# Patient Record
Sex: Male | Born: 1985 | Race: White | Hispanic: No | Marital: Married | Smoking: Never smoker
Health system: Southern US, Community
[De-identification: ages and names within clinical notes are randomized; demographics above are authoritative.]

## PROBLEM LIST (undated history)

## (undated) DIAGNOSIS — G4700 Insomnia, unspecified: Secondary | ICD-10-CM

## (undated) DIAGNOSIS — D619 Aplastic anemia, unspecified: Secondary | ICD-10-CM

## (undated) DIAGNOSIS — E7800 Pure hypercholesterolemia, unspecified: Secondary | ICD-10-CM

## (undated) HISTORY — PX: BONE MARROW TRANSPLANT: SHX200

---

## 2018-10-02 ENCOUNTER — Ambulatory Visit
Admission: EM | Admit: 2018-10-02 | Discharge: 2018-10-02 | Disposition: A | Discharge status: Home / Self Care | Payer: 59

## 2018-10-02 ENCOUNTER — Other Ambulatory Visit: Payer: Self-pay

## 2018-10-02 DIAGNOSIS — R1011 Right upper quadrant pain: Secondary | ICD-10-CM

## 2018-10-02 HISTORY — DX: Aplastic anemia, unspecified: D61.9

## 2018-10-02 HISTORY — DX: Pure hypercholesterolemia, unspecified: E78.00

## 2018-10-02 LAB — POCT URINALYSIS DIP (MANUAL ENTRY)
Bilirubin, UA: NEGATIVE
Blood, UA: NEGATIVE
Glucose, UA: NEGATIVE mg/dL
Ketones, POC UA: NEGATIVE mg/dL
Leukocytes, UA: NEGATIVE
Nitrite, UA: NEGATIVE
Protein Ur, POC: NEGATIVE mg/dL
Spec Grav, UA: 1.01 (ref 1.010–1.025)
Urobilinogen, UA: 0.2 E.U./dL
pH, UA: 6 (ref 5.0–8.0)

## 2018-10-02 NOTE — ED Provider Notes (Signed)
[facility]    CSN: [id number] Arrival date & time: 10/02/18  1554     History   Chief Complaint Chief Complaint  Patient presents with  . Flank Pain    HPI Bobby Duran is a 33 y.o. male presenting for RUQ pain.  Patient states pain is dull/achy and intermittent w/o known aggravating or alleviating factors.  Patient has h/o aplastic anemia and underwent bone marrow transplant (BMT) 8 years ago.  Patient was immunosuppressed 4 years thereafter; not currently immunosuppressed.  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  Patient lives in [place] and has been in touch with his PCP and BMT team since symptom onset.  Patient states his BMT advised getting CT scan done for further eval in conjunction with LFTs.  Patient states he is trying to avoid ED due to concern for COVID.  Denies recent illness, sick contact, fever, dizziness, fatigue, decreased appetite, nausea/vomiting, easy bruising/bleeding, hematochezia, melena, dysuria, hematuria.  Labs last done by PCP in Sept 2019: ast 21, alt 39, ggt 234, Tbili 0.7, direct bili 0.2, alk phos 45, sCr 0.92, hgb 13.7.    Past Medical History:  Diagnosis Date  . Aplastic anemia ([place])   . High cholesterol     There are no active problems to display for this patient.   Past Surgical History:  Procedure Laterality Date  . BONE MARROW TRANSPLANT         Home Medications    Prior to Admission medications   Medication Sig Start Date End Date Taking? Authorizing Provider  LORazepam (ATIVAN) 1 MG tablet Take 1 mg by mouth at bedtime.   Yes [provider]    Family History No family history on file.  Social History Social History   Tobacco Use  . Smoking status: Never Smoker  . Smokeless tobacco: Never Used  Substance Use Topics  . Alcohol use: Yes  . Drug use: Not Currently     Allergies   Patient has no known allergies.   Review of Systems As per HPI   Physical Exam Triage Vital Signs ED Triage Vitals  Enc Vitals Group     BP 10/02/18 1608 (!) 151/93     Pulse Rate 10/02/18 1608 62     Resp 10/02/18 1608 18     Temp 10/02/18 1615 98.3 F (36.8 C)     Temp Source 10/02/18 1615 Oral     SpO2 10/02/18 1608 95 %     Weight --      Height --      Head Circumference --      Peak Flow --      Pain Score 10/02/18 1608 3     Pain Loc --      Pain Edu? --      Excl. in [place]? --    No data found.  Updated Vital Signs BP (!) 151/93 (BP Location: Left Arm)   Pulse 62   Temp 98.3 F (36.8 C) (Oral)   Resp 18   SpO2 95%   Visual Acuity Right Eye Distance:   Left Eye Distance:   Bilateral Distance:    Right Eye Near:   Left Eye Near:    Bilateral Near:     Physical Exam Constitutional:      General: He is not in acute distress.    Appearance: Normal appearance. He is normal weight. He is not toxic-appearing.  HENT:     Head: Normocephalic and atraumatic.  Mouth/Throat:     Mouth: Mucous membranes are moist.     Pharynx: No oropharyngeal exudate or posterior oropharyngeal erythema.  Eyes:     General: No scleral icterus.    Extraocular Movements: Extraocular movements intact.     Conjunctiva/sclera: Conjunctivae normal.  Neck:     Musculoskeletal: Normal range of motion and neck supple.  Cardiovascular:     Rate and Rhythm: Normal rate and regular rhythm.  Pulmonary:     Effort: Pulmonary effort is normal. No respiratory distress.     Breath sounds: Normal breath sounds. No wheezing or rales.  Abdominal:     General: Abdomen is flat. Bowel sounds are normal. There is no distension.     Palpations: Abdomen is soft. There is no mass.     Tenderness: There is abdominal tenderness. There is no right CVA tenderness, left CVA tenderness, guarding or rebound.     Hernia: No hernia is present.     Comments: Right upper quadrant tenderness, negative murphy's sign.  Able to palpate liver edge with deep inspiration.  Pain does  not vary w/ deep inspiration.  Lymphadenopathy:     Cervical: No cervical adenopathy.  Neurological:     Mental Status: He is alert.      UC Treatments / Results  Labs (all labs ordered are listed, but only abnormal results are displayed) Labs Reviewed  CBC WITH DIFFERENTIAL/PLATELET  COMPREHENSIVE METABOLIC PANEL  POCT URINALYSIS DIP (MANUAL ENTRY)    EKG None  Radiology No results found.  Procedures Procedures (including critical care time)  Medications Ordered in UC Medications - No data to display  Initial Impression / Assessment and Plan / UC Course  I have reviewed the triage vital signs and the nursing notes.  Pertinent labs & imaging results that were available during my care of the patient were reviewed by me and considered in my medical decision making (see chart for details).     Pt w/ h/o aplastic anemia in remission after successful BMT presenting w/ RUQ pain x 1 wk.  Labs last done by PCP in Sept 2019: ast 21, alt 39, ggt 234, Tbili 0.7, direct bili 0.2, alk phos 45, sCr 0.92, hgb 13.7.  Patient is stable in office today.  Will check labs and strict return/ER precautions were discussed.  Case was discussed w/ Cathlean Sauer, PA-C who will contact patient with labs/plan when available. Final Clinical Impressions(s) / UC Diagnoses   Final diagnoses:  Abdominal pain, right upper quadrant     Discharge Instructions     Patient educated on return precautions and follow up care w/ PCP and BMT team back home in [place].   Patient will establish care in [place] if he plans on staying for prolonged period of time.  Patient going to call his PCP later today to relay workup. Cannot r/o graft-vs-host disease w/ exam and CMP along - pt verbalized understanding.  Low threshold to go to ER for further workup - stable in office today.    ED Prescriptions    None     Controlled Substance Prescriptions [place] Controlled Substance Registry consulted? Not Applicable   Quincy Sheehan, [place] 10/02/18 1806

## 2018-10-02 NOTE — Discharge Instructions (Addendum)
Patient educated on return precautions and follow up care w/ PCP and BMT team back home in [place].   Patient will establish care in [place] if he plans on staying for prolonged period of time.  Patient going to call his PCP later today to relay workup. Cannot r/o graft-vs-host disease w/ exam and CMP along - pt verbalized understanding.  Low threshold to go to ER for further workup - stable in office today.

## 2018-10-02 NOTE — ED Triage Notes (Signed)
Pt c/o rt flank pain since Saturday, denies urinary difficulty, n/v/d; last NBM this am; tender on palpation

## 2018-10-03 ENCOUNTER — Other Ambulatory Visit: Payer: Self-pay

## 2018-10-03 ENCOUNTER — Emergency Department (HOSPITAL_BASED_OUTPATIENT_CLINIC_OR_DEPARTMENT_OTHER)
Admission: EM | Admit: 2018-10-03 | Discharge: 2018-10-03 | Disposition: A | Discharge status: Home / Self Care | Payer: 59 | Attending: Emergency Medicine | Admitting: Emergency Medicine

## 2018-10-03 ENCOUNTER — Encounter (HOSPITAL_BASED_OUTPATIENT_CLINIC_OR_DEPARTMENT_OTHER): Payer: Self-pay | Admitting: Emergency Medicine

## 2018-10-03 ENCOUNTER — Emergency Department (HOSPITAL_BASED_OUTPATIENT_CLINIC_OR_DEPARTMENT_OTHER): Payer: 59

## 2018-10-03 ENCOUNTER — Telehealth (HOSPITAL_COMMUNITY): Payer: Self-pay | Admitting: Emergency Medicine

## 2018-10-03 DIAGNOSIS — R1011 Right upper quadrant pain: Secondary | ICD-10-CM | POA: Insufficient documentation

## 2018-10-03 DIAGNOSIS — D1803 Hemangioma of intra-abdominal structures: Secondary | ICD-10-CM | POA: Insufficient documentation

## 2018-10-03 HISTORY — DX: Insomnia, unspecified: G47.00

## 2018-10-03 LAB — CBC WITH DIFFERENTIAL/PLATELET
Basophils Absolute: 0 10*3/uL (ref 0.0–0.2)
Basos: 1 %
EOS (ABSOLUTE): 0.2 10*3/uL (ref 0.0–0.4)
Eos: 3 %
Hematocrit: 40.3 % (ref 37.5–51.0)
Hemoglobin: 14.4 g/dL (ref 13.0–17.7)
Immature Grans (Abs): 0 10*3/uL (ref 0.0–0.1)
Immature Granulocytes: 0 %
Lymphocytes Absolute: 2 10*3/uL (ref 0.7–3.1)
Lymphs: 31 %
MCH: 33.6 pg — ABNORMAL HIGH (ref 26.6–33.0)
MCHC: 35.7 g/dL (ref 31.5–35.7)
MCV: 94 fL (ref 79–97)
Monocytes Absolute: 0.6 10*3/uL (ref 0.1–0.9)
Monocytes: 10 %
Neutrophils Absolute: 3.5 10*3/uL (ref 1.4–7.0)
Neutrophils: 55 %
Platelets: 180 10*3/uL (ref 150–450)
RBC: 4.29 x10E6/uL (ref 4.14–5.80)
RDW: 13.1 % (ref 11.6–15.4)
WBC: 6.3 10*3/uL (ref 3.4–10.8)

## 2018-10-03 LAB — COMPREHENSIVE METABOLIC PANEL
ALT: 36 IU/L (ref 0–44)
AST: 17 IU/L (ref 0–40)
Albumin/Globulin Ratio: 1.9 (ref 1.2–2.2)
Albumin: 5 g/dL (ref 4.0–5.0)
Alkaline Phosphatase: 42 IU/L (ref 39–117)
BUN/Creatinine Ratio: 12 (ref 9–20)
BUN: 10 mg/dL (ref 6–20)
Bilirubin Total: 0.5 mg/dL (ref 0.0–1.2)
CO2: 21 mmol/L (ref 20–29)
Calcium: 10.3 mg/dL — ABNORMAL HIGH (ref 8.7–10.2)
Chloride: 102 mmol/L (ref 96–106)
Creatinine, Ser: 0.81 mg/dL (ref 0.76–1.27)
GFR calc Af Amer: 134 mL/min/{1.73_m2} (ref >59)
GFR calc non Af Amer: 116 mL/min/{1.73_m2} (ref >59)
Globulin, Total: 2.6 g/dL (ref 1.5–4.5)
Glucose: 88 mg/dL (ref 65–99)
Potassium: 3.8 mmol/L (ref 3.5–5.2)
Sodium: 139 mmol/L (ref 134–144)
Total Protein: 7.6 g/dL (ref 6.0–8.5)

## 2018-10-03 MED ORDER — MORPHINE SULFATE (PF) 2 MG/ML IV SOLN
2.0000 mg | Freq: Once | INTRAVENOUS | Status: AC
  Administered 2018-10-03: 13:00:00 2 mg via INTRAVENOUS
  Filled 2018-10-03: qty 1

## 2018-10-03 MED ORDER — MORPHINE SULFATE (PF) 4 MG/ML IV SOLN
4.0000 mg | Freq: Once | INTRAVENOUS | Status: AC
  Administered 2018-10-03: 4 mg via INTRAVENOUS
  Filled 2018-10-03: qty 1

## 2018-10-03 MED ORDER — ONDANSETRON HCL 4 MG/2ML IJ SOLN
INTRAMUSCULAR | Status: AC
  Administered 2018-10-03: 11:00:00 4 mg via INTRAVENOUS
  Filled 2018-10-03: qty 2

## 2018-10-03 MED ORDER — ONDANSETRON HCL 4 MG/2ML IJ SOLN
4.0000 mg | Freq: Once | INTRAMUSCULAR | Status: AC
  Administered 2018-10-03: 11:00:00 4 mg via INTRAVENOUS

## 2018-10-03 MED ORDER — HYDROCODONE-ACETAMINOPHEN 5-325 MG PO TABS: 1.0000 | ORAL_TABLET | Freq: Four times a day (QID) | ORAL | 0 refills | Status: AC | PRN

## 2018-10-03 NOTE — ED Triage Notes (Signed)
Right sided abdominal pain radiates to back.  No n/v/d.  No fever.  Symptoms since Saturday.

## 2018-10-03 NOTE — ED Provider Notes (Signed)
[facility] Provider Note   CSN: [id number] Arrival date & time: 10/03/18  1022    History   Chief Complaint Chief Complaint  Patient presents with   Abdominal Pain    HPI Bobby Duran is a 33 y.o. male presenting for evaluation of right upper quadrant abdominal pain.  Patient states he has been having abdominal pain for the past week.  Patient states it is only in the right upper quadrant, however has been gradually worsening.  He describes it as a dull and sharp pain that waxes and wanes in intensity.  Nothing makes it worse, including p.o. intake, movement, position, BM, urination.  Initially, pain was improved with ibuprofen, but this is no longer alleviating his pain.  He denies pain elsewhere in his abdomen.  No radiation of the pain.  He denies fevers, chills, sore throat, cough, chest pain, shortness of breath, nausea, vomiting, urinary symptoms, normal bowel movements.  He has a history of aplastic anemia resulting in a bone marrow transplant and history of immunosuppression.  He is currently not on immunosuppression.  He does have a history of graft-versus-host disease several years ago.  No other medical problems.  He takes Ativan for sleep at night, no other medications.  Additional history obtained from chart review.  Patient was seen at urgent care yesterday, CBC, CMP, UA were reassuring, no leukocytosis, no elevation in bili or LFTs.  UA without blood or infection.     HPI  Past Medical History:  Diagnosis Date   Aplastic anemia ([place])    High cholesterol    Insomnia     There are no active problems to display for this patient.   Past Surgical History:  Procedure Laterality Date   BONE MARROW TRANSPLANT          Home Medications    Prior to Admission medications   Medication Sig Start Date End Date Taking? Authorizing Provider  HYDROcodone-acetaminophen (NORCO/VICODIN) 5-325 MG tablet Take 1 tablet by mouth every 6  (six) hours as needed for severe pain. 10/03/18   Jaymian Bogart, PA-C  LORazepam (ATIVAN) 1 MG tablet Take 1 mg by mouth at bedtime.    [provider]    Family History No family history on file.  Social History Social History   Tobacco Use   Smoking status: Never Smoker   Smokeless tobacco: Never Used  Substance Use Topics   Alcohol use: Yes   Drug use: Not Currently     Allergies   Patient has no known allergies.   Review of Systems Review of Systems  Gastrointestinal: Positive for abdominal pain.  All other systems reviewed and are negative.    Physical Exam Updated Vital Signs BP 114/76    Pulse (!) 52    Temp 97.8 F (36.6 C) (Oral)    Resp 16    Ht 5\' 10"  (1.778 m)    Wt 72.6 kg    SpO2 100%    BMI 22.96 kg/m   Physical Exam Vitals signs and nursing note reviewed.  Constitutional:      General: He is not in acute distress.    Appearance: He is well-developed.     Comments: Appears nontoxic  HENT:     Head: Normocephalic and atraumatic.  Eyes:     Conjunctiva/sclera: Conjunctivae normal.     Pupils: Pupils are equal, round, and reactive to light.  Neck:     Musculoskeletal: Normal range of motion and neck supple.  Cardiovascular:  Rate and Rhythm: Normal rate and regular rhythm.  Pulmonary:     Effort: Pulmonary effort is normal. No respiratory distress.     Breath sounds: Normal breath sounds. No wheezing.  Abdominal:     General: There is no distension.     Palpations: Abdomen is soft.     Tenderness: There is abdominal tenderness in the right upper quadrant. There is no right CVA tenderness, left CVA tenderness, guarding or rebound. Positive signs include Murphy's sign.  Musculoskeletal: Normal range of motion.  Skin:    General: Skin is warm and dry.  Neurological:     Mental Status: He is alert and oriented to person, place, and time.      ED Treatments / Results  Labs (all labs ordered are listed, but only abnormal  results are displayed) Labs Reviewed - No data to display  EKG None  Radiology US Abdomen Limited Ruq  Result Date: 10/03/2018 CLINICAL DATA:  Right upper quadrant pain for 1 week increased over the past day EXAM: ULTRASOUND ABDOMEN LIMITED RIGHT UPPER QUADRANT COMPARISON:  None. FINDINGS: Gallbladder: No gallstones or wall thickening visualized. No sonographic Murphy sign noted by sonographer. Common bile duct: Diameter: 3.3 mm. Liver: Focal hyperechoic lesion is noted which measures 2.2 cm in greatest dimension. Statistically this likely represents a hemangioma. Portal vein is patent on color Doppler imaging with normal direction of blood flow towards the liver. IMPRESSION: Focal hyperechoic lesion within the right lobe of the liver as described. This likely represents a hemangioma. Short-term follow-up in 6 months to assess for stability is recommended. No other focal abnormality is noted. Electronically Signed   By: Inez Catalina M.D.   On: 10/03/2018 12:01    Procedures Procedures (including critical care time)  Medications Ordered in ED Medications  morphine 2 MG/ML injection 2 mg (has no administration in time range)  morphine 4 MG/ML injection 4 mg (4 mg Intravenous Given 10/03/18 1114)  ondansetron (ZOFRAN) injection 4 mg (4 mg Intravenous Given 10/03/18 1113)     Initial Impression / Assessment and Plan / ED Course  I have reviewed the triage vital signs and the nursing notes.  Pertinent labs & imaging results that were available during my care of the patient were reviewed by me and considered in my medical decision making (see chart for details).        Presenting for evaluation of right upper quadrant pain.  Physical examination, he is afebrile and appears nontoxic.  Tenderness palpation of the right upper quadrant, diffuse palpation elsewhere in the abdomen.  No CVA tenderness, and without blood in his urine, low suspicion for kidney stone.  No epigastric pain, less likely  pancreatitis, gastritis, PUD.  Consider gallbladder liver etiologies, despite normal labs.  With no leukocytosis, doubt colonic infection.  Consider viral GI illness.  Will obtain right upper ultrasound for further evaluation.  Treat pain and reassess. Case discussed with attending, Dr. Ashok Cordia evaluated the pt.  Of note, blood pressure is elevated, likely pain related.  Right upper ultrasound reassuring, no signs of gallbladder infection, stones, or concerning liver pathology.  In the setting of reassuring labs reassuring ultrasound, low suspicion for intra-abdominal infection, perforation, obstruction, or surgical abdomen.  On reassessment, patient reports pain was improved with medication.  Blood pressure improved.  Discussed findings with patient.  Discussed that at this time, I have low suspicion for infection, kidney stone, graft-versus-host disease, or need for hospitalization.  I do not believe CT scan or repeat labs to  be beneficial at this time.  Discussed continued symptomatic treatment at home, and follow-up with PCP.  Will give short course of pain medication, PMP checked, patient without concerning controlled substance prescriptions.  Encourage patient to monitor for signs of worsening symptoms including fever, vomiting, severe worsening pain.  At this time, patient appears safe for discharge.  Return precautions given.  Patient states he understands agrees plan.   Final Clinical Impressions(s) / ED Diagnoses   Final diagnoses:  RUQ abdominal pain  Hemangioma of liver    ED Discharge Orders         Ordered    HYDROcodone-acetaminophen (NORCO/VICODIN) 5-325 MG tablet  Every 6 hours PRN     10/03/18 [place], Braxen Dobek, PA-C 10/03/18 1256    Lajean Saver, MD 10/03/18 1343

## 2018-10-03 NOTE — Telephone Encounter (Signed)
Patient contacted and made aware of all results, all questions answered. Patient called stating he was feeling  A lot worse with significant pain, was worried about lab work. Patient tried to reach a PCP to get further workup but was not successful. Patient watning to go to the ER but concerned about his immune status, reviewed several ERs, gave [facility] info [facility]. Pt agreeable to plan, will head there for further workup.

## 2018-10-03 NOTE — Discharge Instructions (Addendum)
Call the resources in the paperwork for primary care Use tylenol/ibuprofen as needed for mild to moderate pain. Use norco as needed for severe or breakthrough pain. Have caution, this may make you tired or groggy. Do not drive or operate heavy machinery while taking this medication.  Use heating pads to help with pain control.  Follow up with primary care for further evaluation.  Return to the ER if you develop fevers, severe worsening pain, persistent nausea/vomiting, or with any new, worsening, or concerning symptoms.

## 2018-10-07 ENCOUNTER — Ambulatory Visit: Payer: 59 | Admitting: Hematology

## 2020-12-29 IMAGING — US ULTRASOUND ABDOMEN LIMITED
1 series · 14 of 25 positions shown · non-contrast
Comparison: None.

CLINICAL DATA: Right upper quadrant pain for 1 week increased over
the past day

EXAM:
ULTRASOUND ABDOMEN LIMITED RIGHT UPPER QUADRANT

[Series 1: ultrasound abdomen limited · 14 of 71 slices shown]
[im 1/71]
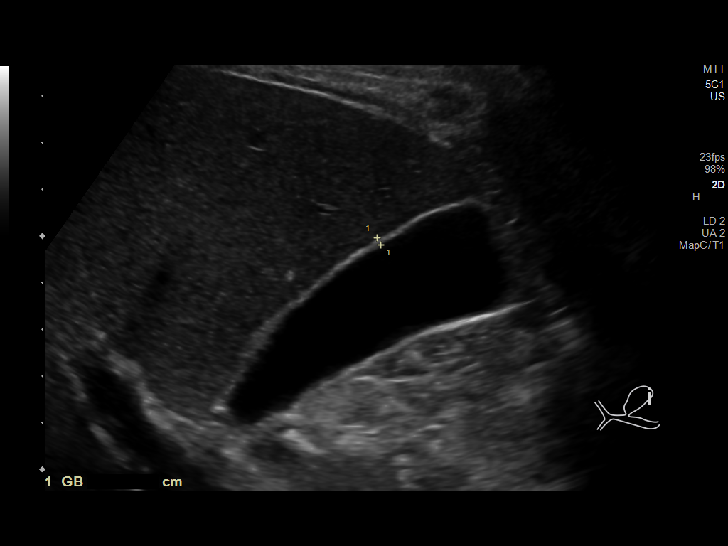
[im 6/71]
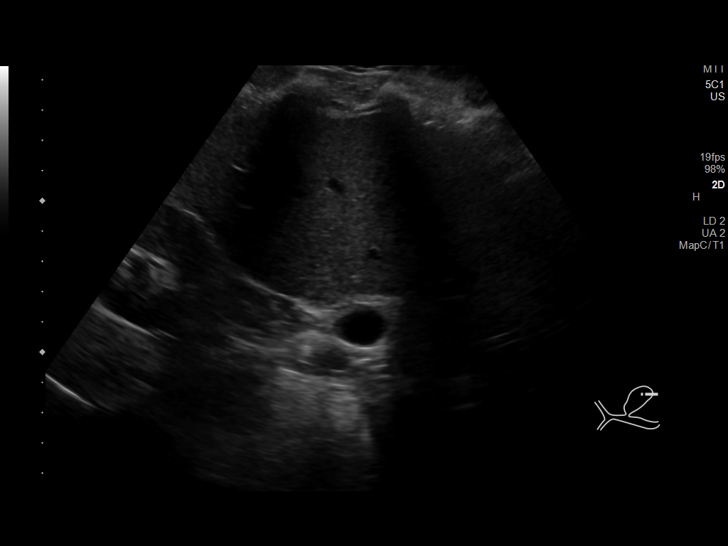
[im 12/71]
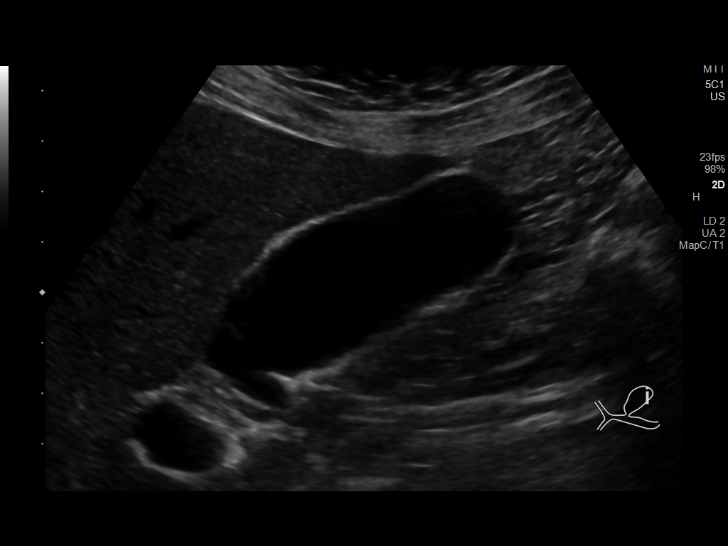
[im 18/71]
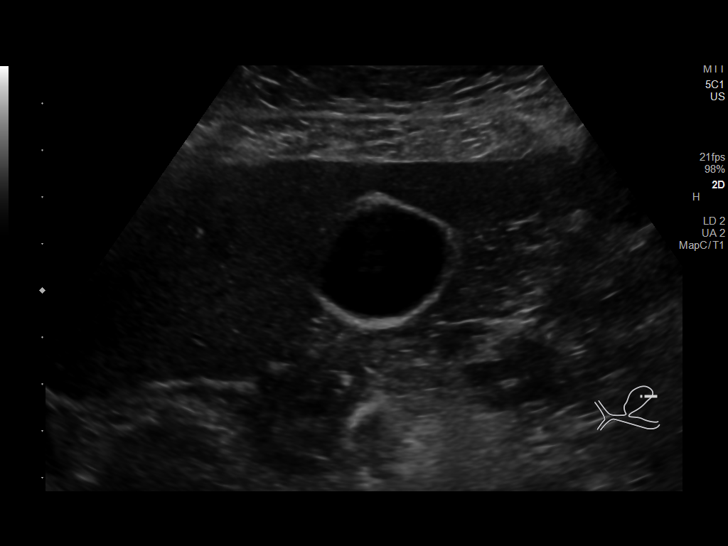
[im 24/71]
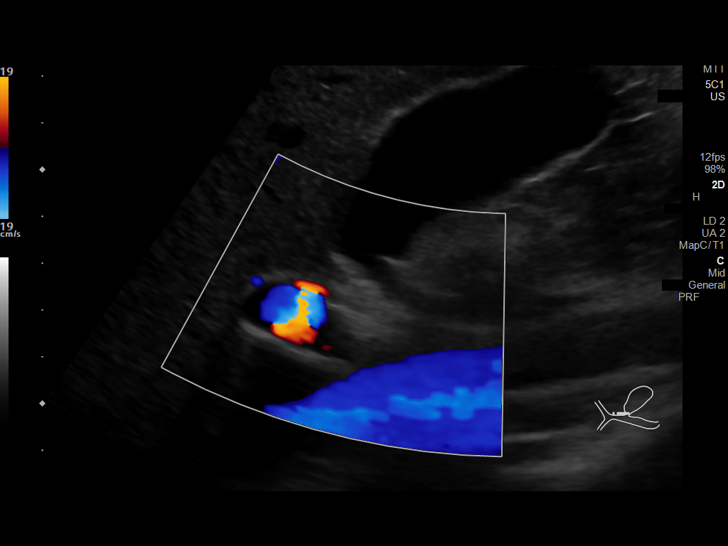
[im 27/71]
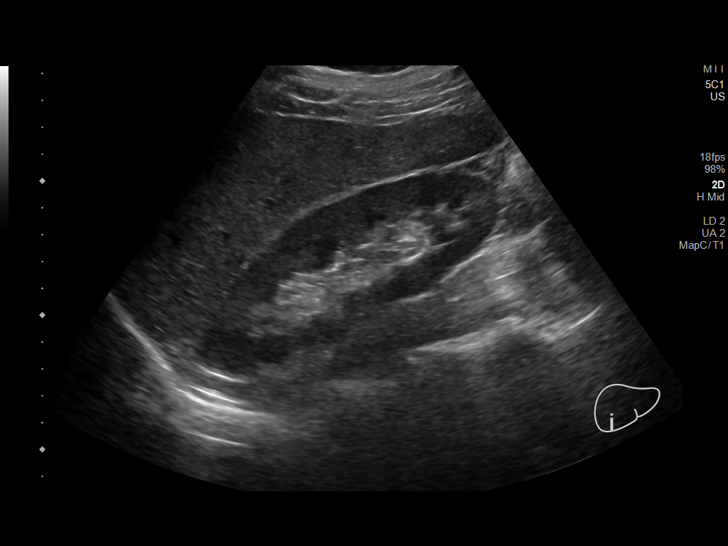
[im 33/71]
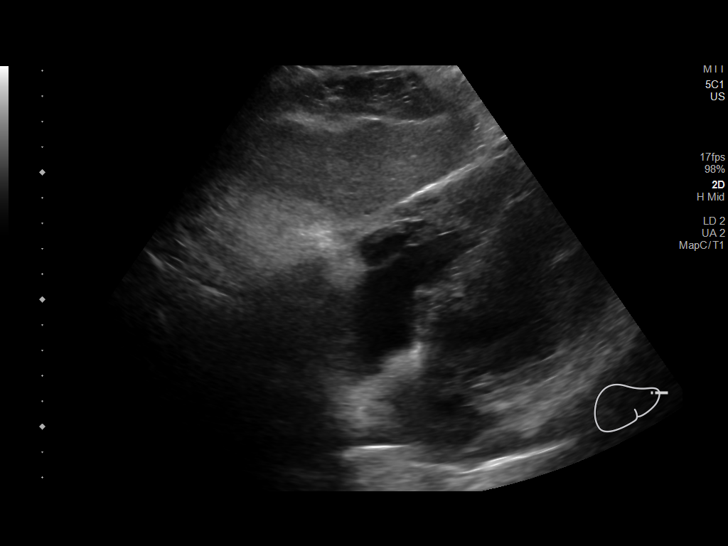
[im 38/71]
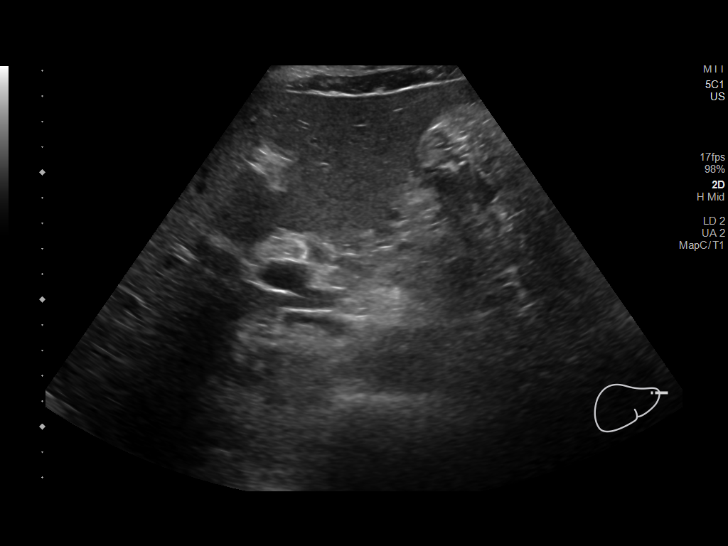
[im 44/71]
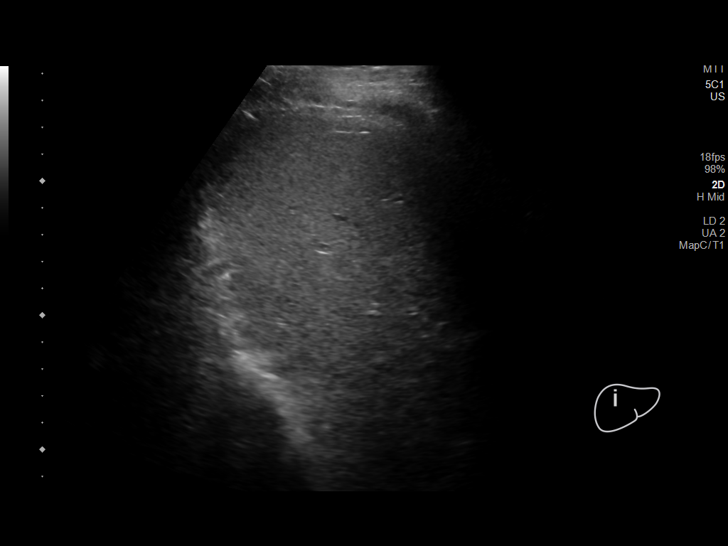
[im 47/71]
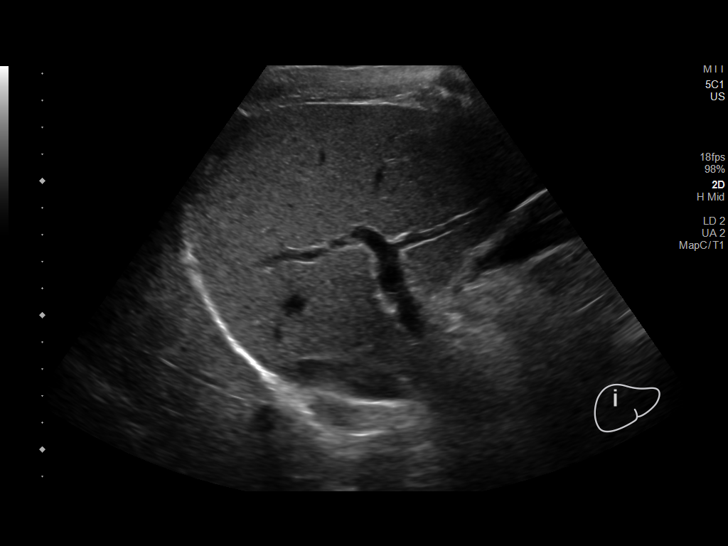
[im 53/71]
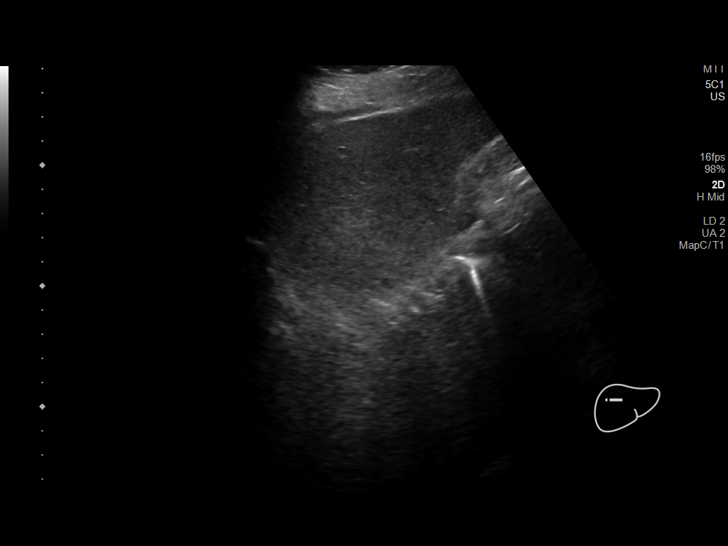
[im 59/71]
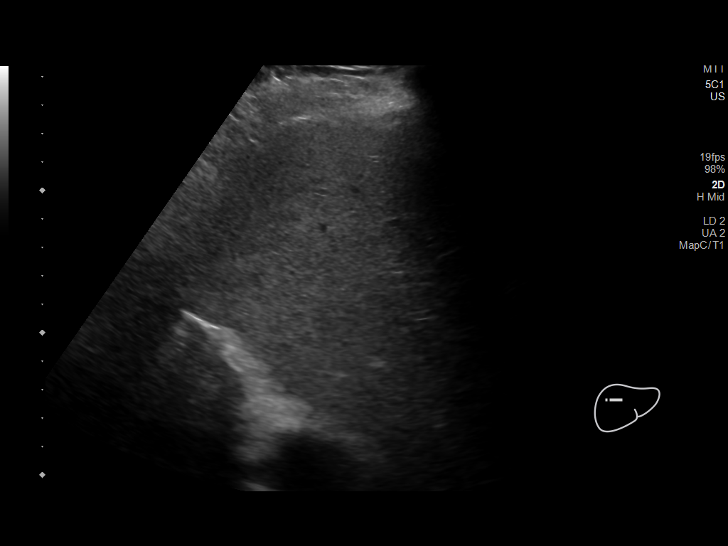
[im 65/71]
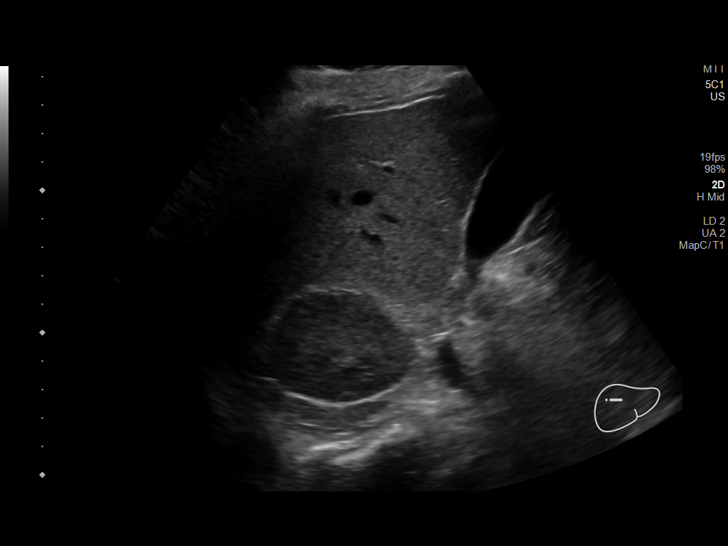
[im 71/71]
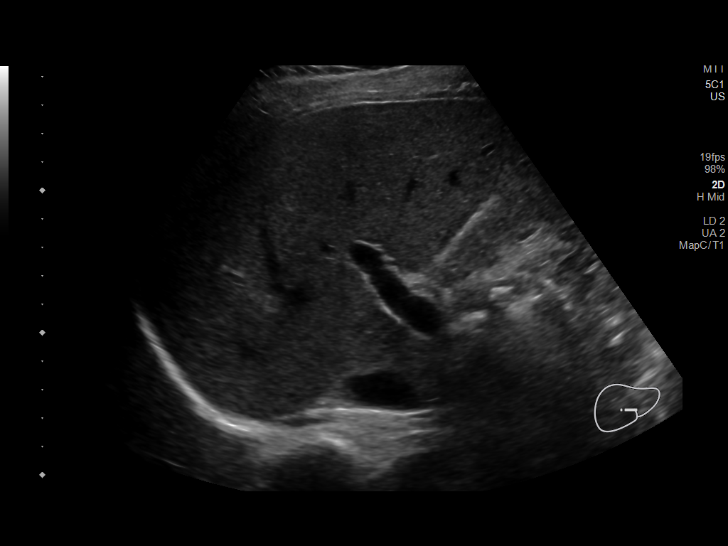

[14 of 25 positions shown; findings below may reference images not displayed]

FINDINGS: Gallbladder:

No gallstones or wall thickening visualized. No sonographic Murphy
sign noted by sonographer.

Common bile duct:

Diameter: 3.3 mm.

Liver:

Focal hyperechoic lesion is noted which measures 2.2 cm in greatest
dimension. Statistically this likely represents a hemangioma. Portal
vein is patent on color Doppler imaging with normal direction of
blood flow towards the liver.
IMPRESSION: Focal hyperechoic lesion within the right lobe of the liver as
described. This likely represents a hemangioma. Short-term follow-up
in 6 months to assess for stability is recommended.

No other focal abnormality is noted.
